# Patient Record
Sex: Female | Born: 2000 | Race: White | Hispanic: No | Marital: Single | Smoking: Never smoker
Health system: Southern US, Community
[De-identification: ages and names within clinical notes are randomized; demographics above are authoritative.]

## PROBLEM LIST (undated history)

## (undated) DIAGNOSIS — F909 Attention-deficit hyperactivity disorder, unspecified type: Secondary | ICD-10-CM

## (undated) DIAGNOSIS — M5432 Sciatica, left side: Secondary | ICD-10-CM

---
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
## 2021-03-09 ENCOUNTER — Emergency Department (HOSPITAL_BASED_OUTPATIENT_CLINIC_OR_DEPARTMENT_OTHER)
Admission: EM | Admit: 2021-03-09 | Discharge: 2021-03-09 | Disposition: A | Discharge status: Home / Self Care | Payer: Managed Care, Other (non HMO) | Attending: Emergency Medicine | Admitting: Emergency Medicine

## 2021-03-09 ENCOUNTER — Encounter (HOSPITAL_BASED_OUTPATIENT_CLINIC_OR_DEPARTMENT_OTHER): Payer: Self-pay

## 2021-03-09 ENCOUNTER — Other Ambulatory Visit: Payer: Self-pay

## 2021-03-09 DIAGNOSIS — M549 Dorsalgia, unspecified: Secondary | ICD-10-CM

## 2021-03-09 DIAGNOSIS — R202 Paresthesia of skin: Secondary | ICD-10-CM | POA: Insufficient documentation

## 2021-03-09 DIAGNOSIS — M545 Low back pain, unspecified: Secondary | ICD-10-CM | POA: Insufficient documentation

## 2021-03-09 HISTORY — DX: Sciatica, left side: M54.32

## 2021-03-09 MED ORDER — KETOROLAC TROMETHAMINE 15 MG/ML IJ SOLN
15.0000 mg | Freq: Once | INTRAMUSCULAR | Status: AC
  Administered 2021-03-09: 15 mg via INTRAMUSCULAR
  Filled 2021-03-09: qty 1

## 2021-03-09 MED ORDER — ACETAMINOPHEN 325 MG PO TABS
650.0000 mg | ORAL_TABLET | Freq: Once | ORAL | Status: AC
  Administered 2021-03-09: 650 mg via ORAL
  Filled 2021-03-09: qty 2

## 2021-03-09 MED ORDER — LIDOCAINE 5 % EX PTCH
1.0000 | MEDICATED_PATCH | CUTANEOUS | Status: DC
  Administered 2021-03-09: 1 via TRANSDERMAL
  Filled 2021-03-09: qty 1

## 2021-03-09 MED ORDER — LIDOCAINE 5 % EX PTCH: 1.0000 | MEDICATED_PATCH | CUTANEOUS | 0 refills | Status: AC

## 2021-03-09 MED ORDER — PREDNISONE 50 MG PO TABS: 50.0000 mg | ORAL_TABLET | Freq: Every day | ORAL | 0 refills | Status: AC

## 2021-03-09 NOTE — ED Triage Notes (Signed)
Pt reports that she numbness from left hip down her leg. Reports she noticed episodes of left lower back pain 15 minutes after picking up child at daycare yesterday. Pain in back of left thigh as well as numbness. This morning entire leg is numb.

## 2021-03-09 NOTE — Discharge Instructions (Signed)
You were seen in the emergency department today for your low back pain and .  Your physical exam and vital signs are very reassuring.  The muscles in your left low back and buttock are in what is called spasm, meaning they are inappropriately tightened up.  This can be quite painful.  To help with your pain you may take Tylenol and / or NSAID medication (such as ibuprofen or naproxen) to help with your pain. You are also experiencing inflammation of the nerves in this area that is causing the tingling and numbness you are experiencing in the left leg. For this reason you have been prescribed steroid to take daily for the next 5 days.   You may also utilize topical pain relief such as Biofreeze, IcyHot, or topical lidocaine patches.  I also recommend that you apply heat to the area, such as a hot shower or heating pad, and follow heat application with massage of the muscles that are most tight.  Please return to the emergency department if you develop any numbness/tingling/weakness in your arms or legs, any difficulty urinating, or urinary incontinence chest pain, shortness of breath, abdominal pain, nausea or vomiting that does not stop, or any other new severe symptoms.

## 2021-03-09 NOTE — ED Provider Notes (Signed)
[facility] EMERGENCY DEPARTMENT Provider Note   CSN: [id number] Arrival date & time: 03/09/21  0846     History Chief Complaint  Patient presents with   Numbness    Sharon Patterson is a 20 y.o. female who presents with concern for left low back pain that radiates down the back of her left thigh as well as numbness and tingling down the left leg since yesterday.  History of left-sided sciatic pain.  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  Has been having the symptoms since that time.  He is uncomfortable with walking.  Denies any saddle anesthesia, urinary retention or incontinence.  No foot drop. No history of IV drug use or recent trauma.  She is not anticoagulated and has no tendencies to bruise or bleed easily.  I personally reviewed the patient medical records; previous history of ADHD.  HPI     Past Medical History:  Diagnosis Date   ADHD (attention deficit hyperactivity disorder)    Sciatica of left side     There are no problems to display for this patient.   History reviewed. No pertinent surgical history.   OB History   No obstetric history on file.     No family history on file.  Social History   Tobacco Use   Smoking status: Never   Smokeless tobacco: Never  Vaping Use   Vaping Use: Every day  Substance Use Topics   Alcohol use: No   Drug use: No    Home Medications Prior to Admission medications   Medication Sig Start Date End Date Taking? Authorizing Provider  lidocaine (LIDODERM) 5 % Place 1 patch onto the skin daily. Remove & Discard patch within 12 hours or as directed by MD 03/09/21  Yes Lashon Beringer, Lupe Carney R, PA-C  predniSONE (DELTASONE) 50 MG tablet Take 1 tablet (50 mg total) by mouth daily with breakfast for 5 days. 03/09/21 03/14/21 Yes Darrold Bezek, Lupe Carney R, PA-C  amoxicillin (AMOXIL) 500 MG capsule Take 1 capsule (500 mg total) by mouth 3 (three)  times daily. 07/13/20   Terrilee Files, MD  ibuprofen (CHILDRENS IBUPROFEN) 100 MG/5ML suspension Take 14.5 mLs (290 mg total) by mouth every 6 (six) hours as needed for fever. 07/29/12   Mabe, Latanya Maudlin, MD  lisdexamfetamine (VYVANSE) 20 MG capsule Take 20 mg by mouth every morning.    [provider]    Allergies    Patient has no known allergies.  Review of Systems   Review of Systems  Constitutional: Negative.   HENT: Negative.    Respiratory: Negative.    Cardiovascular: Negative.   Gastrointestinal: Negative.   Musculoskeletal:  Positive for back pain.  Neurological:  Positive for numbness. Negative for dizziness, syncope, facial asymmetry, weakness, light-headedness and headaches.   Physical Exam Updated Vital Signs BP 112/71   Pulse 80   Temp 98.1 F (36.7 C)   Resp 18   Ht 5\' 4"  (1.626 m)   Wt 54.4 kg   LMP 02/07/2021 (Approximate)   SpO2 100%   BMI 20.60 kg/m   Physical Exam Vitals and nursing note reviewed.  HENT:     Head: Normocephalic and atraumatic.     Mouth/Throat:     Mouth: Mucous membranes are moist.     Pharynx: No oropharyngeal exudate or posterior oropharyngeal erythema.  Eyes:     General:        Right eye:  No discharge.        Left eye: No discharge.     Conjunctiva/sclera: Conjunctivae normal.  Cardiovascular:     Rate and Rhythm: Normal rate and regular rhythm.     Pulses: Normal pulses.          Dorsalis pedis pulses are 2+ on the right side and 2+ on the left side.  Pulmonary:     Effort: Pulmonary effort is normal. No respiratory distress.     Breath sounds: Normal breath sounds. No wheezing or rales.  Abdominal:     General: Bowel sounds are normal. There is no distension.     Palpations: Abdomen is soft.     Tenderness: There is no abdominal tenderness.  Musculoskeletal:        General: No deformity.     Cervical back: Normal and neck supple.     Thoracic back: Normal.     Lumbar back: Spasms and tenderness present.  No bony tenderness. Negative right straight leg raise test and negative left straight leg raise test.       Back:     Right hip: Normal.     Left hip: Normal.     Right upper leg: Normal.     Left upper leg: Normal.     Right knee: Normal.     Left knee: Normal.     Right lower leg: Normal. No edema.     Left lower leg: Normal. No edema.     Right ankle: Normal.     Right Achilles Tendon: Normal.     Left ankle: Normal.     Left Achilles Tendon: Normal.     Right foot: Normal.     Left foot: Normal.     Comments: Symmetric strength in plantar and dorsiflexion bilaterally.  Decreased sensation in the left foot though patient is able to feel painful stimuli.  Full range of motion of the ankles bilaterally with symmetry in plantar and dorsiflexion.    Skin:    General: Skin is warm and dry.     Capillary Refill: Capillary refill takes less than 2 seconds.  Neurological:     Mental Status: She is alert and oriented to person, place, and time. Mental status is at baseline.     Gait: Gait abnormal.     Comments: Patient favoring the left leg when ambulating due to discomfort but is able to ambulate independently.  Psychiatric:        Mood and Affect: Mood normal.    ED Results / Procedures / Treatments   Labs (all labs ordered are listed, but only abnormal results are displayed) Labs Reviewed - No data to display  EKG None  Radiology No results found.  Procedures Procedures   Medications Ordered in ED Medications  ketorolac (TORADOL) 15 MG/ML injection 15 mg (15 mg Intramuscular Given 03/09/21 1044)  acetaminophen (TYLENOL) tablet 650 mg (650 mg Oral Given 03/09/21 1040)    ED Course  I have reviewed the triage vital signs and the nursing notes.  Pertinent labs & imaging results that were available during my care of the patient were reviewed by me and considered in my medical decision making (see chart for details).    MDM Rules/Calculators/A&P                          20 year old female who presents with concern for LBP and left leg numbness tingling intermittently.   The emergent  differential diagnosis for low back pain includes acute ligamentous and muscular injury, cord compression syndrome, pathologic fracture, transverse myelitis, vertebral osteomyelitis, discitis, and epidural abscess.  Vital signs are normal on intake with the exception of very mild tachycardia.  Cardiopulmonary exam is normal, abdominal exam is benign.  Patient is vascularly intact in all 4 extremities and has normal symmetric strength and motor function in the lower extremities.  Mildly diminished sensation in the dorsum of the right foot though able to sense painful stimuli.  Ambulatory though favoring the left leg due to discomfort in the left back.  Given lack of saddle anesthesia or symptoms concerning for cauda equina syndrome, no indication for imaging at this time.  Doubt epidural abscess or hematoma given lack of recent trauma or high risk behavior.  Favor lumbar radiculopathy.  Lidoderm patch, Toradol, Tylenol offered in the emergency department.  Will discharge with steroid burst due to radicular symptoms.   May follow-up with her PCP.  Ivelisse voiced understanding of her medical evaluation and treatment plan.  Each of her questions answered to his expressed satisfaction.  Strict return precautions were given.  Patient is well-appearing, stable, and appropriate for discharge at this time.    This chart was dictated using voice recognition software, Dragon. Despite the best efforts of this provider to proofread and correct errors, errors may still occur which can change documentation meaning.  Final Clinical Impression(s) / ED Diagnoses Final diagnoses:  None    Rx / DC Orders ED Discharge Orders          Ordered    lidocaine (LIDODERM) 5 %  Every 24 hours        03/09/21 1033    predniSONE (DELTASONE) 50 MG tablet  Daily with breakfast        03/09/21 1033              Yeng Perz, Eugene Gavia, PA-C 03/09/21 2131    Terrilee Files, MD 03/10/21 [phone]
